# Patient Record
Sex: Male | Born: 1990 | Race: Black or African American | Hispanic: No | Marital: Single | Smoking: Never smoker
Health system: Southern US, Community
[De-identification: ages and names within clinical notes are randomized; demographics above are authoritative.]

---

## 2013-07-28 ENCOUNTER — Emergency Department (INDEPENDENT_AMBULATORY_CARE_PROVIDER_SITE_OTHER): Payer: Medicaid - Out of State

## 2013-07-28 ENCOUNTER — Emergency Department (INDEPENDENT_AMBULATORY_CARE_PROVIDER_SITE_OTHER)
Admission: EM | Admit: 2013-07-28 | Discharge: 2013-07-28 | Disposition: A | Discharge status: Home / Self Care | Payer: Medicaid - Out of State | Source: Home / Self Care | Attending: Family Medicine | Admitting: Family Medicine

## 2013-07-28 ENCOUNTER — Encounter (HOSPITAL_COMMUNITY): Payer: Self-pay | Admitting: Emergency Medicine

## 2013-07-28 DIAGNOSIS — S93409A Sprain of unspecified ligament of unspecified ankle, initial encounter: Secondary | ICD-10-CM

## 2013-07-28 DIAGNOSIS — S93401A Sprain of unspecified ligament of right ankle, initial encounter: Secondary | ICD-10-CM

## 2013-07-28 MED ORDER — HYDROCODONE-ACETAMINOPHEN 5-325 MG PO TABS
ORAL_TABLET | ORAL | Status: AC
  Filled 2013-07-28: qty 2

## 2013-07-28 MED ORDER — HYDROCODONE-ACETAMINOPHEN 5-325 MG PO TABS
2.0000 | ORAL_TABLET | Freq: Once | ORAL | Status: AC
  Administered 2013-07-28: 2 via ORAL

## 2013-07-28 NOTE — ED Notes (Signed)
Jumping on a trampoline yesterday, did a flip and landed wrong.  He was barefoot at the time.  C/o pain in R foot and goes up into R ankle.  He was walking on it at first but now can't bear wt.  R ppp.  Swollen lat.> medial ankle also.

## 2013-07-28 NOTE — Discharge Instructions (Signed)
Please ice your ankle for the next 24-48 hours and take ibuprofen for pain. You can use an ankle brace if that helps, but you may not need it. Do not run or jump on this ankle for about 2 weeks and then start back with your exercise slowly. Remember to do ankle exercises to keep the ankle from getting stiff.   If you have worsening pain, then please follow up.  Take Care,   Dr. Clinton SawyerWilliamson     Ankle Sprain An ankle sprain is an injury to the strong, fibrous tissues (ligaments) that hold the bones of your ankle joint together.  CAUSES An ankle sprain is usually caused by a fall or by twisting your ankle. Ankle sprains most commonly occur when you step on the outer edge of your foot, and your ankle turns inward. People who participate in sports are more prone to these types of injuries.  SYMPTOMS   Pain in your ankle. The pain may be present at rest or only when you are trying to stand or walk.  Swelling.  Bruising. Bruising may develop immediately or within 1 to 2 days after your injury.  Difficulty standing or walking, particularly when turning corners or changing directions. DIAGNOSIS  Your caregiver will ask you details about your injury and perform a physical exam of your ankle to determine if you have an ankle sprain. During the physical exam, your caregiver will press on and apply pressure to specific areas of your foot and ankle. Your caregiver will try to move your ankle in certain ways. An X-ray exam may be done to be sure a bone was not broken or a ligament did not separate from one of the bones in your ankle (avulsion fracture).  TREATMENT  Certain types of braces can help stabilize your ankle. Your caregiver can make a recommendation for this. Your caregiver may recommend the use of medicine for pain. If your sprain is severe, your caregiver may refer you to a surgeon who helps to restore function to parts of your skeletal system (orthopedist) or a physical therapist. HOME CARE  INSTRUCTIONS   Apply ice to your injury for 1 2 days or as directed by your caregiver. Applying ice helps to reduce inflammation and pain.  Put ice in a plastic bag.  Place a towel between your skin and the bag.  Leave the ice on for 15-20 minutes at a time, every 2 hours while you are awake.  Only take over-the-counter or prescription medicines for pain, discomfort, or fever as directed by your caregiver.  Elevate your injured ankle above the level of your heart as much as possible for 2 3 days.  If your caregiver recommends crutches, use them as instructed. Gradually put weight on the affected ankle. Continue to use crutches or a cane until you can walk without feeling pain in your ankle.  If you have a plaster splint, wear the splint as directed by your caregiver. Do not rest it on anything harder than a pillow for the first 24 hours. Do not put weight on it. Do not get it wet. You may take it off to take a shower or bath.  You may have been given an elastic bandage to wear around your ankle to provide support. If the elastic bandage is too tight (you have numbness or tingling in your foot or your foot becomes cold and blue), adjust the bandage to make it comfortable.  If you have an air splint, you may blow more air  into it or let air out to make it more comfortable. You may take your splint off at night and before taking a shower or bath. Wiggle your toes in the splint several times per day to decrease swelling. SEEK MEDICAL CARE IF:   You have rapidly increasing bruising or swelling.  Your toes feel extremely cold or you lose feeling in your foot.  Your pain is not relieved with medicine. SEEK IMMEDIATE MEDICAL CARE IF:  Your toes are numb or blue.  You have severe pain that is increasing. MAKE SURE YOU:   Understand these instructions.  Will watch your condition.  Will get help right away if you are not doing well or get worse. Document Released: 03/18/2005 Document  Revised: 12/11/2011 Document Reviewed: 03/30/2011 [facility] Patient Information 2014 [place], [place].

## 2013-07-28 NOTE — ED Provider Notes (Signed)
Medical screening examination/treatment/procedure(s) were performed by a resident physician or non-physician practitioner and as the supervising physician I was immediately available for consultation/collaboration.  Verlisa Vara, MD    Zuha Dejonge S Shalandria Elsbernd, MD 07/28/13 2133 

## 2013-07-28 NOTE — ED Provider Notes (Signed)
CSN: [id number]     Arrival date & time 07/28/13  1610 History   First MD Initiated Contact with Patient 07/28/13 1720     Chief Complaint  Patient presents with  . Foot Pain   (Consider location/radiation/quality/duration/timing/severity/associated sxs/prior Treatment) HPI  23 year old M with right ankle and foot injury. It occurred 1 days ago while jumping on a trampoline. Pain is worsening and located on top of foot and lateral ankle. Associated with swelling, decreased ROM and difficulty walking. Has not tried anything for relief.   History reviewed. No pertinent past medical history. History reviewed. No pertinent past surgical history. Family History  Problem Relation Age of Onset  . Heart Problems Father     heart murmur, pacemaker   Social - tobacco and marijuana smoker   Review of Systems Negative for knee pain, thigh pain, numbness of toes Allergies  Review of patient's allergies indicates no known allergies.  Home Medications   Prior to Admission medications   Not on File   BP 154/109  Pulse 51  Temp(Src) 98.2 F (36.8 C) (Oral)  Resp 16  SpO2 99% Physical Exam Gen: young AAM, non ill appearing, but uncomfortable MSK: swelling and tenderness over the dorsal surface of the right foot particularly over the fourth and fifth metatarsals without obvious deformity; tenderness along the lateral malleolus with swelling; decrease active range of motion ED Course  Procedures (including critical care time) Labs Review Labs Reviewed - No data to display  Imaging Review Dg Ankle Complete Right  07/28/2013   CLINICAL DATA:  History of previous right ankle fracture at and unspecified site. The patient recently fell and injured the ankle and is reporting anterior and lateral malleolar region pain  EXAM: RIGHT ANKLE - COMPLETE 3+ VIEW  COMPARISON:  None.  FINDINGS: The ankle joint mortise is preserved. The talar dome is intact. There is no evidence of an acute malleolar  fracture. The talus and calcaneus appear intact. The visualized bones of the hindfoot appear normal.  IMPRESSION: There is no acute bony abnormality of the right ankle. There may be minimal soft tissue swelling.   Electronically Signed   By: David  Swazil[place]Jordan   On: 07/28/2013 18:04   Dg Foot Complete Right  07/28/2013   CLINICAL DATA:  Right foot pain status post trauma; the specific site of symptoms is not noted; there is a history of a right foot fracture last year.  EXAM: RIGHT FOOT COMPLETE - 3+ VIEW  COMPARISON:  None.  FINDINGS: Three views of the right foot reveal the bones to be adequately mineralized. The interphalangeal joints and the metatarsophalangeal joints appear normal. The bones of the hindfoot appear intact. The overlying soft tissues are normal in appearance.  IMPRESSION: Negative.   Electronically Signed   By: David  [place]   On: 07/28/2013 17:52     MDM   1. Right ankle sprain    Treat conservatively. Follow up PRN.     Garnetta BuddyEdward V Crue Otero, MD 07/28/13 [phone]

## 2013-09-27 ENCOUNTER — Emergency Department (HOSPITAL_COMMUNITY)
Admission: EM | Admit: 2013-09-27 | Discharge: 2013-09-27 | Disposition: A | Discharge status: Home / Self Care | Payer: Medicaid - Out of State | Attending: Emergency Medicine | Admitting: Emergency Medicine

## 2013-09-27 ENCOUNTER — Encounter (HOSPITAL_COMMUNITY): Payer: Self-pay | Admitting: Emergency Medicine

## 2013-09-27 DIAGNOSIS — IMO0002 Reserved for concepts with insufficient information to code with codable children: Secondary | ICD-10-CM | POA: Insufficient documentation

## 2013-09-27 DIAGNOSIS — Z791 Long term (current) use of non-steroidal anti-inflammatories (NSAID): Secondary | ICD-10-CM | POA: Insufficient documentation

## 2013-09-27 DIAGNOSIS — M461 Sacroiliitis, not elsewhere classified: Secondary | ICD-10-CM | POA: Insufficient documentation

## 2013-09-27 MED ORDER — HYDROCODONE-ACETAMINOPHEN 5-325 MG PO TABS
1.0000 | ORAL_TABLET | Freq: Once | ORAL | Status: AC
  Administered 2013-09-27: 1 via ORAL
  Filled 2013-09-27: qty 1

## 2013-09-27 MED ORDER — PREDNISONE 20 MG PO TABS: ORAL_TABLET | ORAL | Status: AC

## 2013-09-27 MED ORDER — HYDROCODONE-ACETAMINOPHEN 5-325 MG PO TABS: 1.0000 | ORAL_TABLET | Freq: Three times a day (TID) | ORAL | Status: AC | PRN

## 2013-09-27 MED ORDER — PREDNISONE 20 MG PO TABS
60.0000 mg | ORAL_TABLET | Freq: Once | ORAL | Status: AC
  Administered 2013-09-27: 60 mg via ORAL
  Filled 2013-09-27: qty 3

## 2013-09-27 MED ORDER — IBUPROFEN 400 MG PO TABS
800.0000 mg | ORAL_TABLET | Freq: Once | ORAL | Status: AC
  Administered 2013-09-27: 800 mg via ORAL
  Filled 2013-09-27: qty 2

## 2013-09-27 MED ORDER — IBUPROFEN 800 MG PO TABS: 800.0000 mg | ORAL_TABLET | Freq: Three times a day (TID) | ORAL | Status: AC

## 2013-09-27 NOTE — ED Notes (Addendum)
Pt in c/o lower back pain and states it radiates down his right leg, worse with movement, symptoms for a month

## 2013-09-27 NOTE — ED Notes (Signed)
Crackers given to family member; delay explained.

## 2013-09-27 NOTE — ED Notes (Signed)
PA at bedside.

## 2013-09-27 NOTE — ED Provider Notes (Signed)
CSN: [id number]     Arrival date & time 09/27/13  1644 History  This chart was scribed for non-physician practitioner Dalia HeadingHannah Muthersbough, PA-C working with No att. providers found by Joaquin MusicKristina Sanchez-Matthews, ED Scribe. This patient was seen in room TR11C/TR11C and the patient's care was started at 8:25 PM .   Chief Complaint  Patient presents with  . Back Pain   The history is provided by the patient. No language interpreter was used.   HPI Comments: Tyler Bruce is a 23 y.o. male who presents to the Emergency Department complaining of ongoing lower back pain that radiates down R leg that began 1 month ago. He states he is "unable to move his leg" due to feeling a pulled muscle in R leg but has normal gait; describes pain as "pulling sensation to his lower back". He states "his R leg feels heavy when walking". States he suspected he pulled a muscle and believed the pain would subside on it's own. Reports flipping and landing in a hole last month; was seen at an urgent care and informed he possible pulled a muscle. Pt states pain worsens with movement . Denies having medical problems, taking daily medications, bowel and bladder incontinence, and taking OTC medications PTA.  History reviewed. No pertinent past medical history. History reviewed. No pertinent past surgical history. Family History  Problem Relation Age of Onset  . Heart Problems Father     heart murmur, pacemaker   History  Substance Use Topics  . Smoking status: Never Smoker   . Smokeless tobacco: Not on file  . Alcohol Use: No    Review of Systems  Gastrointestinal: Negative for blood in stool.  Genitourinary: Negative for dysuria and difficulty urinating.  Musculoskeletal: Positive for arthralgias, back pain, joint swelling and myalgias. Negative for gait problem.   Allergies  Review of patient's allergies indicates no known allergies.  Home Medications   Prior to Admission medications   Medication Sig  Start Date End Date Taking? Authorizing Provider  HYDROcodone-acetaminophen (NORCO/VICODIN) 5-325 MG per tablet Take 1 tablet by mouth every 8 (eight) hours as needed for moderate pain or severe pain. 09/27/13   Hannah Muthersbaugh, PA-C  ibuprofen (ADVIL,MOTRIN) 800 MG tablet Take 1 tablet (800 mg total) by mouth 3 (three) times daily. With food 09/27/13   Dahlia ClientHannah Muthersbaugh, PA-C  predniSONE (DELTASONE) 20 MG tablet 3 tabs po daily x 3 days, then 2 tabs x 3 days, then 1.5 tabs x 3 days, then 1 tab x 3 days, then 0.5 tabs x 3 days 09/27/13   Dahlia ClientHannah Muthersbaugh, PA-C   BP 148/96  Pulse 67  Temp(Src) 97.8 F (36.6 C) (Oral)  Resp 18  Wt 158 lb (71.668 kg)  SpO2 100%  Physical Exam  Nursing note and vitals reviewed. Constitutional: He is oriented to person, place, and time. He appears well-developed and well-nourished. No distress.  HENT:  Head: Normocephalic and atraumatic.  Mouth/Throat: Oropharynx is clear and moist. No oropharyngeal exudate.  Eyes: Conjunctivae are normal.  Neck: Normal range of motion. Neck supple.  Full ROM without pain  Cardiovascular: Normal rate, regular rhythm, normal heart sounds and intact distal pulses.   No murmur heard. No tachycardia  Pulmonary/Chest: Effort normal and breath sounds normal. No respiratory distress. He has no wheezes.  Abdominal: Soft. Bowel sounds are normal. He exhibits no distension. There is no tenderness.  abd soft and nontender  Genitourinary:  DRE with normal rectal tone.   No saddle anesthesia   Musculoskeletal:  Full active range of motion of the T-spine and L-spine No tenderness to palpation of the spinous processes of the T-spine or L-spine. No tenderness to palpation of the paraspinous muscles of the L-spine. Pain to palpation over the R SI joint. Full active range of motion of all joints of the lower legs including full active hip flexion bilaterally.   Lymphadenopathy:    He has no cervical adenopathy.  Neurological: He  is alert and oriented to person, place, and time. He has normal reflexes. He exhibits normal muscle tone. Coordination normal.  Mental Status:  Alert, oriented, thought content appropriate. Speech fluent without evidence of aphasia. Able to follow 2 step commands without difficulty.  Motor:  5/5 in upper and lower extremities bilaterally including strong and equal grip strength and dorsiflexion/plantar flexion Sensory: Pinprick and light touch normal in all extremities.  Deep Tendon Reflexes: 2+ and symmetric  Babinski: negative Cerebellar: normal finger-to-nose with bilateral upper extremities Gait: antalgic gait with decreased use of the right leg and normal balance CV: distal pulses palpable throughout    Skin: Skin is warm and dry. No rash noted. He is not diaphoretic. No erythema.  No erythema or induration over the SI joint  Psychiatric: He has a normal mood and affect. His behavior is normal.   ED Course  Procedures  DIAGNOSTIC STUDIES: Oxygen Saturation is 99% on RA, normal by my interpretation.    COORDINATION OF CARE: 8:31 PM-Discussed treatment plan which includes speak with attending regarding pts case. Pt agreed to plan.   8:33 PM-Will discharge pt with pain medication and steroids. [facility] Pt agreed to plan.  Labs Review Labs Reviewed - No data to display  Imaging Review No results found.   EKG Interpretation None     MDM   Final diagnoses:  Sacroiliitis    [name] presents with lower right back pain.  On exam patient with pain to the right SI joint. Patient is alert, oriented, nontoxic and nonseptic appearing. He denies history of HIV, diabetes or IV drug use. Patient denies no injuries to his back.  Normal neurologic exam including reflexes and negative babinski.  No evidence of septic arthritis.  Patient likely with sacroiliitis secondary to his back flip one month ago. Patient will be given prednisone,  pain control and anti-inflammatories.  I have personally reviewed patient's vitals, nursing note and any pertinent labs or imaging.  I performed an undressed physical exam.    At this time, it has been determined that no acute conditions requiring further emergency intervention. The patient/guardian have been advised of the diagnosis and plan. I reviewed all labs and imaging including any potential incidental findings. We have discussed signs and symptoms that warrant return to the ED, such as fever, chills, loss of bowel or bladder control, worsening gait.  Patient/guardian has voiced understanding and agreed to follow-up with the PCP or specialist in one week for further evaluation.  Vital signs are stable at discharge.   BP 148/96  Pulse 67  Temp(Src) 97.8 F (36.6 C) (Oral)  Resp 18  Wt 158 lb (71.668 kg)  SpO2 100%   I personally performed the services described in this documentation, which was scribed in my presence. The recorded information has been reviewed and is accurate.    Dahlia ClientHannah Muthersbaugh, PA-C 09/27/13 2053

## 2013-09-27 NOTE — Discharge Instructions (Signed)
1. Medications: Prednisone, ibuprofen, Vicodin, usual home medications 2. Treatment: rest, drink plenty of fluids, gentle stretching as discussed, alternate ice and heat 3. Follow Up: Please followup with your primary doctor for discussion of your diagnoses and further evaluation after today's visit; if you do not have a primary care doctor use the resource guide provided to find one; return to the emergency department for fevers, chills, nausea, vomiting, loss of bowel or bladder control, or other concerning symptoms  Sacroiliac Joint Dysfunction The sacroiliac joint connects the lower part of the spine (the sacrum) with the bones of the pelvis. CAUSES  Sometimes, there is no obvious reason for sacroiliac joint dysfunction. Other times, it may occur   During pregnancy.  After injury, such as:  Car accidents.  Sport-related injuries.  Work-related injuries.  Due to one leg being shorter than the other.  Due to other conditions that affect the joints, such as:  Rheumatoid arthritis.  Gout.  Psoriasis.  Joint infection (septic arthritis). SYMPTOMS  Symptoms may include:  Pain in the:  Lower back.  Buttocks.  Groin.  Thighs and legs.  Difficult sitting, standing, walking, lying, bending or lifting. DIAGNOSIS  A number of tests may be used to help diagnose the cause of sacroiliac joint dysfunction, including:  Imaging tests to look for other causes of pain, including:  MRI.  CT scan.  Bone scan.  Diagnostic injection: During a special x-ray (called fluoroscopy), a needle is put into the sacroiliac joint. A numbing medicine is injected into the joint. If the pain is improved or stopped, the diagnosis of sacroiliac joint dysfunction is more likely. TREATMENT  There are a number of types of treatment used for sacroiliac joint dysfunction, including:  Only take over-the-counter or prescription medicines for pain, discomfort, or fever as directed by your  caregiver.  Medications to relax muscles.  Rest. Decreasing activity can help cut down on painful muscle spasms and allow the back to heal.  Application of heat or ice to the lower back may improve muscle spasms and soothe pain.  Brace. A special back brace, called a sacroiliac belt, can help support the joint while your back is healing.  Physical therapy can help teach comfortable positions and exercises to strengthen muscles that support the sacroiliac joint.  Cortisone injections. Injections of steroid medicine into the joint can help decrease swelling and improve pain.  Hyaluronic acid injections. This chemical improves lubrication within the sacroiliac joint, thereby decreasing pain.  Radiofrequency ablation. A special needle is placed into the joint, where it burns away nerves that are carrying pain messages from the joint.  Surgery. Because pain occurs during movement of the joint, screws and plates may be installed in order to limit or prevent joint motion. HOME CARE INSTRUCTIONS   Take all medications exactly as directed.  Follow instructions regarding both rest and physical activity, to avoid worsening the pain.  Do physical therapy exercises exactly as prescribed. SEEK IMMEDIATE MEDICAL CARE IF:  You experience increasingly severe pain.  You develop new symptoms, such as numbness or tingling in your legs or feet.  You lose bladder or bowel control. Document Released: 06/14/2008 Document Revised: 06/10/2011 Document Reviewed: 06/14/2008 [facility] Patient Information 2015 [place], [place]. This information is not intended to replace advice given to you by your health care provider. Make sure you discuss any questions you have with your health care provider.

## 2013-09-27 NOTE — ED Provider Notes (Signed)
Medical screening examination/treatment/procedure(s) were performed by non-physician practitioner and as supervising physician I was immediately available for consultation/collaboration.   EKG Interpretation None        Kristen N Ward, DO 09/27/13 2314 

## 2015-09-20 IMAGING — CR DG ANKLE COMPLETE 3+V*R*
3 series · 3 of 3 positions shown · non-contrast
Comparison: None.

CLINICAL DATA: History of previous right ankle fracture at and
unspecified site. The patient recently fell and injured the ankle
and is reporting anterior and lateral malleolar region pain

EXAM:
RIGHT ANKLE - COMPLETE 3+ VIEW

[view not recorded (1 of 3)]
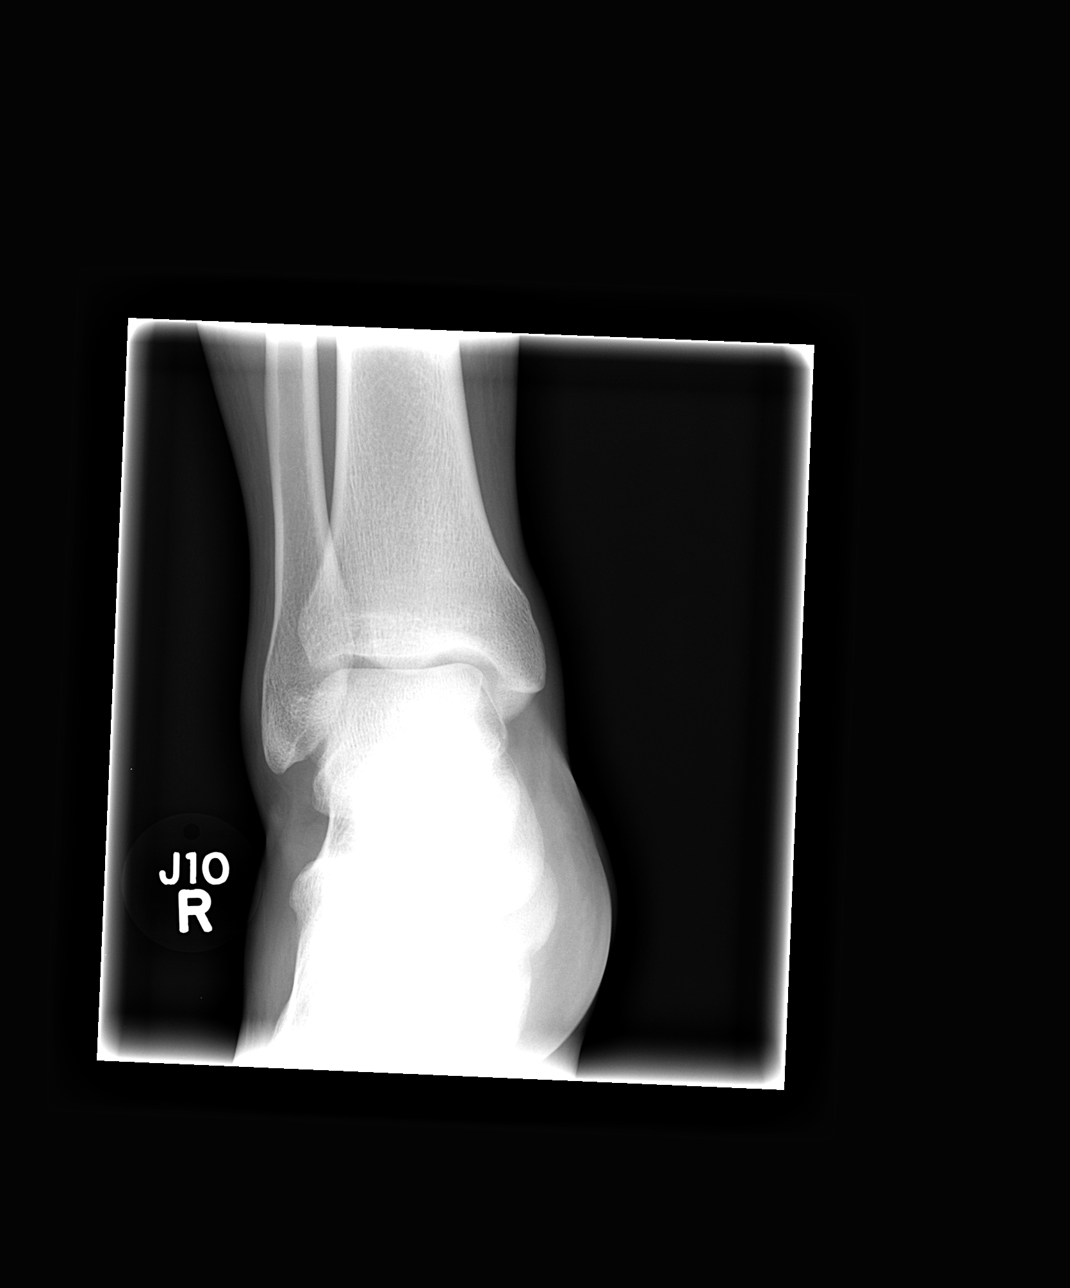

[view not recorded (2 of 3)]
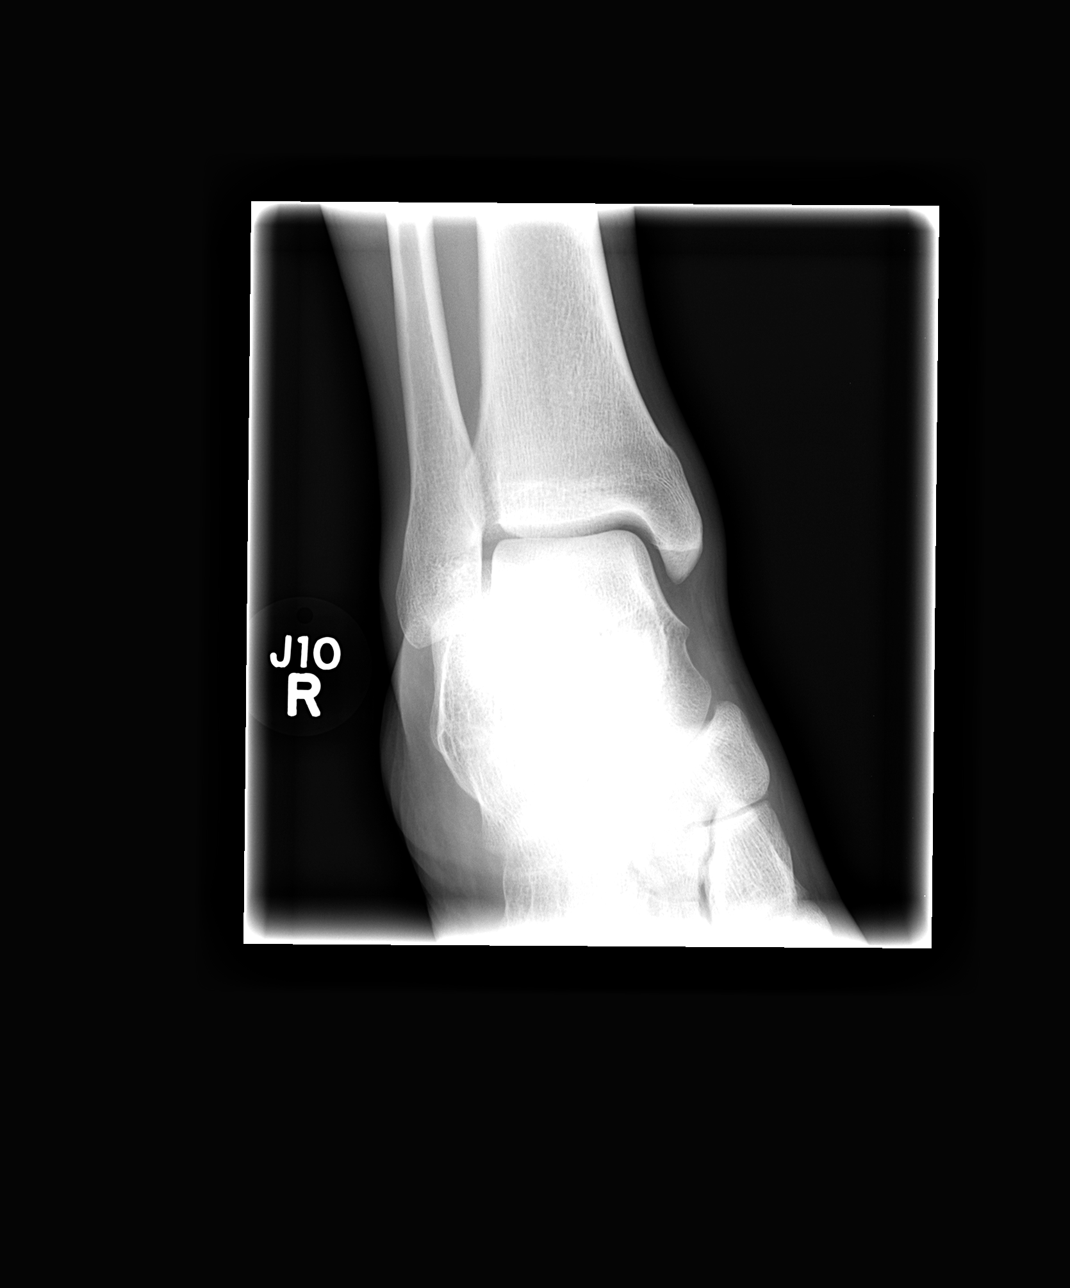

[view not recorded (3 of 3)]
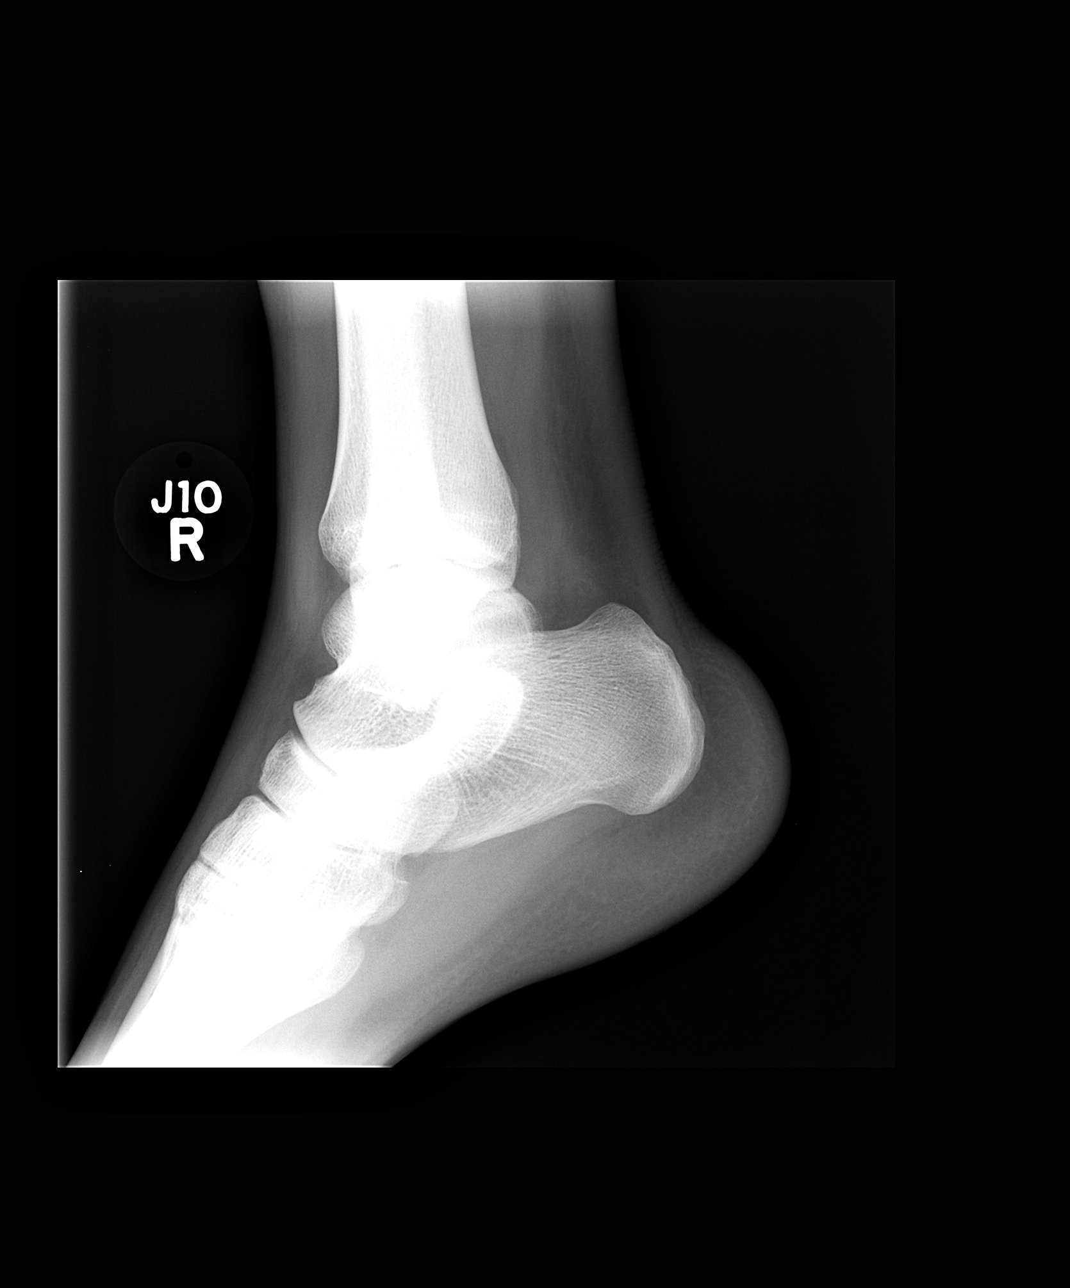

[3 of 3 positions shown; findings below may reference images not displayed]

FINDINGS: The ankle joint mortise is preserved. The talar dome is intact.
There is no evidence of an acute malleolar fracture. The talus and
calcaneus appear intact. The visualized bones of the hindfoot appear
normal.
IMPRESSION: There is no acute bony abnormality of the right ankle. There may be
minimal soft tissue swelling.
